# Patient Record
Sex: Female | Born: 1980 | Race: Black or African American | Hispanic: No | Marital: Single | Smoking: Former smoker
Health system: Southern US, Community
[De-identification: ages and names within clinical notes are randomized; demographics above are authoritative.]

## PROBLEM LIST (undated history)

## (undated) DIAGNOSIS — F419 Anxiety disorder, unspecified: Secondary | ICD-10-CM

## (undated) DIAGNOSIS — J45909 Unspecified asthma, uncomplicated: Secondary | ICD-10-CM

## (undated) HISTORY — DX: Anxiety disorder, unspecified: F41.9

## (undated) HISTORY — PX: APPENDECTOMY: SHX54

## (undated) HISTORY — DX: Unspecified asthma, uncomplicated: J45.909

---

## 2007-11-25 ENCOUNTER — Inpatient Hospital Stay (HOSPITAL_COMMUNITY): Admission: AD | Admit: 2007-11-25 | Discharge: 2007-11-27 | Payer: Self-pay | Admitting: Obstetrics

## 2009-04-15 ENCOUNTER — Emergency Department (HOSPITAL_COMMUNITY): Admission: EM | Admit: 2009-04-15 | Discharge: 2009-04-15 | Payer: Self-pay | Admitting: Family Medicine

## 2009-06-20 ENCOUNTER — Emergency Department (HOSPITAL_COMMUNITY): Admission: EM | Admit: 2009-06-20 | Discharge: 2009-06-20 | Payer: Self-pay | Admitting: Family Medicine

## 2010-12-30 LAB — POCT RAPID STREP A (OFFICE): Streptococcus, Group A Screen (Direct): NEGATIVE

## 2010-12-30 LAB — GC/CHLAMYDIA PROBE AMP, GENITAL: GC Probe Amp, Genital: NEGATIVE

## 2011-06-19 LAB — CBC
HCT: 27.9 — ABNORMAL LOW
HCT: 35.4 — ABNORMAL LOW
Hemoglobin: 12
Hemoglobin: 9.7 — ABNORMAL LOW
MCHC: 33.9
MCHC: 34.9
MCV: 90.8
MCV: 91.7
RBC: 3.9
RDW: 12.6
WBC: 9.4

## 2013-09-02 ENCOUNTER — Other Ambulatory Visit: Payer: Self-pay | Admitting: Occupational Medicine

## 2013-09-02 ENCOUNTER — Ambulatory Visit: Payer: Self-pay

## 2013-09-02 DIAGNOSIS — R52 Pain, unspecified: Secondary | ICD-10-CM

## 2013-10-23 ENCOUNTER — Ambulatory Visit (INDEPENDENT_AMBULATORY_CARE_PROVIDER_SITE_OTHER): Payer: 59 | Admitting: Obstetrics & Gynecology

## 2013-10-23 ENCOUNTER — Encounter: Payer: Self-pay | Admitting: Obstetrics & Gynecology

## 2013-10-23 VITALS — BP 137/88 | HR 89 | Temp 98.3°F | Wt 186.0 lb

## 2013-10-23 DIAGNOSIS — Z3202 Encounter for pregnancy test, result negative: Secondary | ICD-10-CM

## 2013-10-23 DIAGNOSIS — R102 Pelvic and perineal pain: Secondary | ICD-10-CM

## 2013-10-23 DIAGNOSIS — Z01419 Encounter for gynecological examination (general) (routine) without abnormal findings: Secondary | ICD-10-CM

## 2013-10-23 DIAGNOSIS — Z113 Encounter for screening for infections with a predominantly sexual mode of transmission: Secondary | ICD-10-CM

## 2013-10-23 LAB — POCT URINALYSIS DIPSTICK
BILIRUBIN UA: NEGATIVE
Blood, UA: NEGATIVE
Glucose, UA: NEGATIVE
KETONES UA: NEGATIVE
NITRITE UA: NEGATIVE
Spec Grav, UA: 1.02
Urobilinogen, UA: NEGATIVE
pH, UA: 5

## 2013-10-23 LAB — POCT URINE PREGNANCY: PREG TEST UR: NEGATIVE

## 2013-10-23 NOTE — Progress Notes (Signed)
Subjective:     Maria Howe is a 33 y.o. female here for a routine exam.  Current complaints:  Maria Howe states that she is having some intermittent right lower pelvic pain.  Maria Howe describes it at a dull achy pain.  Maria Howe states that she has not had change in d/c but has noticed odor.  Maria Howe also has nexplanon that is due to be removed in March. Maria Howe would like to know if it could be changed at today's visit. Maria Howe thinks she is in need of pap today.  Personal health questionnaire reviewed: yes.   Gynecologic History Patient's last menstrual period was 10/11/2013. Contraception: Nexplanon Last Pap: unsure. Results were: normal Last mammogram: n/a  The following portions of the patient's history were reviewed and updated as appropriate: allergies, current medications, past family history, past medical history, past social history, past surgical history and problem list.  Review of Systems Pertinent items are noted in HPI.   Objective:    BP 137/88  Pulse 89  Temp(Src) 98.3 F (36.8 C)  Wt 186 lb (84.369 kg)  LMP 10/11/2013      General appearance: alert Breasts: normal appearance, no masses or tenderness Abdomen: soft, non-tender; bowel sounds normal; no masses,  no organomegaly Pelvic: cervix normal in appearance, external genitalia normal, no adnexal masses or tenderness, uterus normal size, shape, and consistency and vagina normal without discharge    Assessment:   Healthy female exam. H/o primary dysmenorrhea   Plan:  Return for Nexplanon re-insertion

## 2013-10-24 ENCOUNTER — Encounter: Payer: Self-pay | Admitting: Obstetrics & Gynecology

## 2013-10-24 LAB — GC/CHLAMYDIA PROBE AMP
CT Probe RNA: NEGATIVE
GC Probe RNA: NEGATIVE

## 2013-10-24 LAB — WET PREP BY MOLECULAR PROBE
CANDIDA SPECIES: NEGATIVE
GARDNERELLA VAGINALIS: POSITIVE — AB
Trichomonas vaginosis: NEGATIVE

## 2013-10-24 LAB — PAP IG W/ RFLX HPV ASCU

## 2013-10-24 LAB — RPR

## 2013-10-24 LAB — HIV ANTIBODY (ROUTINE TESTING W REFLEX): HIV: NONREACTIVE

## 2013-10-24 NOTE — Patient Instructions (Signed)

## 2013-10-28 ENCOUNTER — Encounter: Payer: Self-pay | Admitting: Obstetrics & Gynecology

## 2013-10-29 ENCOUNTER — Encounter: Payer: Self-pay | Admitting: Obstetrics & Gynecology

## 2013-10-29 ENCOUNTER — Ambulatory Visit (INDEPENDENT_AMBULATORY_CARE_PROVIDER_SITE_OTHER): Payer: 59 | Admitting: Obstetrics & Gynecology

## 2013-10-29 VITALS — BP 135/82 | HR 85

## 2013-10-29 DIAGNOSIS — Z30017 Encounter for initial prescription of implantable subdermal contraceptive: Secondary | ICD-10-CM

## 2013-10-29 DIAGNOSIS — Z3046 Encounter for surveillance of implantable subdermal contraceptive: Secondary | ICD-10-CM

## 2013-10-29 DIAGNOSIS — Z3202 Encounter for pregnancy test, result negative: Secondary | ICD-10-CM

## 2013-10-29 DIAGNOSIS — Z01818 Encounter for other preprocedural examination: Secondary | ICD-10-CM

## 2013-10-29 DIAGNOSIS — Z309 Encounter for contraceptive management, unspecified: Secondary | ICD-10-CM

## 2013-10-29 LAB — POCT URINE PREGNANCY: Preg Test, Ur: NEGATIVE

## 2013-10-29 MED ORDER — NORETHINDRONE ACETATE 5 MG PO TABS: 5.0000 mg | ORAL_TABLET | Freq: Every day | ORAL | Status: DC

## 2013-10-29 MED ORDER — ETONOGESTREL 68 MG ~~LOC~~ IMPL: 1.0000 | DRUG_IMPLANT | Freq: Once | SUBCUTANEOUS | Status: AC

## 2013-10-29 NOTE — Addendum Note (Signed)
Addended by: Glendell DockerKNIGHT, Pershing Skidmore on: 10/29/2013 10:47 AM   Modules accepted: Orders
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
## 2013-11-05 ENCOUNTER — Ambulatory Visit: Payer: Self-pay | Admitting: Obstetrics & Gynecology

## 2013-11-10 ENCOUNTER — Other Ambulatory Visit: Payer: Self-pay | Admitting: *Deleted

## 2013-11-10 DIAGNOSIS — B9689 Other specified bacterial agents as the cause of diseases classified elsewhere: Secondary | ICD-10-CM

## 2013-11-10 DIAGNOSIS — N760 Acute vaginitis: Principal | ICD-10-CM

## 2013-11-10 MED ORDER — METRONIDAZOLE 500 MG PO TABS: 500.0000 mg | ORAL_TABLET | Freq: Two times a day (BID) | ORAL | Status: DC

## 2013-11-13 ENCOUNTER — Ambulatory Visit: Payer: Self-pay | Admitting: Obstetrics & Gynecology

## 2014-08-22 ENCOUNTER — Emergency Department (HOSPITAL_COMMUNITY)
Admission: EM | Admit: 2014-08-22 | Discharge: 2014-08-22 | Disposition: A | Discharge status: Home / Self Care | Payer: 59 | Attending: Emergency Medicine | Admitting: Emergency Medicine

## 2014-08-22 ENCOUNTER — Emergency Department (HOSPITAL_COMMUNITY): Payer: 59

## 2014-08-22 ENCOUNTER — Encounter (HOSPITAL_COMMUNITY): Payer: Self-pay | Admitting: Emergency Medicine

## 2014-08-22 DIAGNOSIS — Z720 Tobacco use: Secondary | ICD-10-CM | POA: Insufficient documentation

## 2014-08-22 DIAGNOSIS — Y998 Other external cause status: Secondary | ICD-10-CM | POA: Insufficient documentation

## 2014-08-22 DIAGNOSIS — S5011XA Contusion of right forearm, initial encounter: Secondary | ICD-10-CM | POA: Diagnosis not present

## 2014-08-22 DIAGNOSIS — Y92410 Unspecified street and highway as the place of occurrence of the external cause: Secondary | ICD-10-CM | POA: Diagnosis not present

## 2014-08-22 DIAGNOSIS — Z79899 Other long term (current) drug therapy: Secondary | ICD-10-CM | POA: Diagnosis not present

## 2014-08-22 DIAGNOSIS — J45909 Unspecified asthma, uncomplicated: Secondary | ICD-10-CM | POA: Insufficient documentation

## 2014-08-22 DIAGNOSIS — F419 Anxiety disorder, unspecified: Secondary | ICD-10-CM | POA: Insufficient documentation

## 2014-08-22 DIAGNOSIS — Y9389 Activity, other specified: Secondary | ICD-10-CM | POA: Diagnosis not present

## 2014-08-22 DIAGNOSIS — S8001XA Contusion of right knee, initial encounter: Secondary | ICD-10-CM | POA: Diagnosis not present

## 2014-08-22 DIAGNOSIS — Z792 Long term (current) use of antibiotics: Secondary | ICD-10-CM | POA: Diagnosis not present

## 2014-08-22 DIAGNOSIS — Z7952 Long term (current) use of systemic steroids: Secondary | ICD-10-CM | POA: Insufficient documentation

## 2014-08-22 DIAGNOSIS — M7918 Myalgia, other site: Secondary | ICD-10-CM

## 2014-08-22 DIAGNOSIS — Z880 Allergy status to penicillin: Secondary | ICD-10-CM | POA: Diagnosis not present

## 2014-08-22 DIAGNOSIS — S8002XA Contusion of left knee, initial encounter: Secondary | ICD-10-CM | POA: Insufficient documentation

## 2014-08-22 DIAGNOSIS — S40011A Contusion of right shoulder, initial encounter: Secondary | ICD-10-CM | POA: Diagnosis not present

## 2014-08-22 DIAGNOSIS — X088XXA Exposure to other specified smoke, fire and flames, initial encounter: Secondary | ICD-10-CM | POA: Insufficient documentation

## 2014-08-22 DIAGNOSIS — S30202A Contusion of unspecified external genital organ, female, initial encounter: Secondary | ICD-10-CM | POA: Diagnosis not present

## 2014-08-22 DIAGNOSIS — S4991XA Unspecified injury of right shoulder and upper arm, initial encounter: Secondary | ICD-10-CM | POA: Diagnosis present

## 2014-08-22 LAB — CARBOXYHEMOGLOBIN
Carboxyhemoglobin: 2.2 % — ABNORMAL HIGH (ref 0.5–1.5)
Methemoglobin: 2.7 % — ABNORMAL HIGH (ref 0.0–1.5)
O2 SAT: 97.4 %
TOTAL HEMOGLOBIN: 14.1 g/dL (ref 12.0–16.0)

## 2014-08-22 MED ORDER — METHOCARBAMOL 500 MG PO TABS: 1000.0000 mg | ORAL_TABLET | Freq: Four times a day (QID) | ORAL | Status: AC | PRN

## 2014-08-22 MED ORDER — METHOCARBAMOL 500 MG PO TABS
1000.0000 mg | ORAL_TABLET | Freq: Once | ORAL | Status: AC
  Administered 2014-08-22: 1000 mg via ORAL
  Filled 2014-08-22: qty 2
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
## 2014-08-22 NOTE — ED Notes (Signed)
Patient transported to X-ray 

## 2014-09-16 ENCOUNTER — Encounter (HOSPITAL_COMMUNITY): Payer: Self-pay | Admitting: Emergency Medicine

## 2014-09-16 ENCOUNTER — Emergency Department (INDEPENDENT_AMBULATORY_CARE_PROVIDER_SITE_OTHER)
Admission: EM | Admit: 2014-09-16 | Discharge: 2014-09-16 | Disposition: A | Discharge status: Home / Self Care | Payer: Self-pay | Source: Home / Self Care | Attending: Emergency Medicine | Admitting: Emergency Medicine

## 2014-09-16 DIAGNOSIS — R630 Anorexia: Secondary | ICD-10-CM

## 2014-09-16 DIAGNOSIS — M549 Dorsalgia, unspecified: Secondary | ICD-10-CM

## 2014-09-16 DIAGNOSIS — R5382 Chronic fatigue, unspecified: Secondary | ICD-10-CM

## 2014-09-16 DIAGNOSIS — R51 Headache: Secondary | ICD-10-CM

## 2014-09-16 DIAGNOSIS — R519 Headache, unspecified: Secondary | ICD-10-CM

## 2014-09-16 DIAGNOSIS — K529 Noninfective gastroenteritis and colitis, unspecified: Secondary | ICD-10-CM

## 2014-09-16 DIAGNOSIS — R110 Nausea: Secondary | ICD-10-CM

## 2014-09-16 DIAGNOSIS — M542 Cervicalgia: Secondary | ICD-10-CM

## 2014-09-16 DIAGNOSIS — G8929 Other chronic pain: Secondary | ICD-10-CM

## 2014-09-16 MED ORDER — PROCHLORPERAZINE MALEATE 5 MG PO TABS: 5.0000 mg | ORAL_TABLET | Freq: Three times a day (TID) | ORAL | Status: AC | PRN

## 2014-09-16 NOTE — Discharge Instructions (Signed)
My best advice to you would be to seek additional evaluation with your primary care doctor as soon as possible. May switch to using Compazine instead of Zofran for your nausea.  Back Exercises Back exercises help treat and prevent back injuries. The goal of back exercises is to increase the strength of your abdominal and back muscles and the flexibility of your back. These exercises should be started when you no longer have back pain. Back exercises include:  Pelvic Tilt. Lie on your back with your knees bent. Tilt your pelvis until the lower part of your back is against the floor. Hold this position 5 to 10 sec and repeat 5 to 10 times.  Knee to Chest. Pull first 1 knee up against your chest and hold for 20 to 30 seconds, repeat this with the other knee, and then both knees. This may be done with the other leg straight or bent, whichever feels better.  Sit-Ups or Curl-Ups. Bend your knees 90 degrees. Start with tilting your pelvis, and do a partial, slow sit-up, lifting your trunk only 30 to 45 degrees off the floor. Take at least 2 to 3 seconds for each sit-up. Do not do sit-ups with your knees out straight. If partial sit-ups are difficult, simply do the above but with only tightening your abdominal muscles and holding it as directed.  Hip-Lift. Lie on your back with your knees flexed 90 degrees. Push down with your feet and shoulders as you raise your hips a couple inches off the floor; hold for 10 seconds, repeat 5 to 10 times.  Back arches. Lie on your stomach, propping yourself up on bent elbows. Slowly press on your hands, causing an arch in your low back. Repeat 3 to 5 times. Any initial stiffness and discomfort should lessen with repetition over time.  Shoulder-Lifts. Lie face down with arms beside your body. Keep hips and torso pressed to floor as you slowly lift your head and shoulders off the floor. Do not overdo your exercises, especially in the beginning. Exercises may cause you some  mild back discomfort which lasts for a few minutes; however, if the pain is more severe, or lasts for more than 15 minutes, do not continue exercises until you see your caregiver. Improvement with exercise therapy for back problems is slow.  See your caregivers for assistance with developing a proper back exercise program. Document Released: 10/19/2004 Document Revised: 12/04/2011 Document Reviewed: 07/13/2011 [facility] Patient Information 2015 [place] [place]. This information is not intended to replace advice given to you by your health care provider. Make sure you discuss any questions you have with your health care provider.  Chronic Pain Chronic pain can be defined as pain that is off and on and lasts for 3-6 months or longer. Many things cause chronic pain, which can make it difficult to make a diagnosis. There are many treatment options available for chronic pain. However, finding a treatment that works well for you may require trying various approaches until the right one is found. Many people benefit from a combination of two or more types of treatment to control their pain. SYMPTOMS  Chronic pain can occur anywhere in the body and can range from mild to very severe. Some types of chronic pain include:  Headache.  Low back pain.  Cancer pain.  Arthritis pain.  Neurogenic pain. This is pain resulting from damage to nerves. People with chronic pain may also have other symptoms such as:  Depression.  Anger.  Insomnia.  Anxiety.  DIAGNOSIS  Your health care provider will help diagnose your condition over time. In many cases, the initial focus will be on excluding possible conditions that could be causing the pain. Depending on your symptoms, your health care provider may order tests to diagnose your condition. Some of these tests may include:   Blood tests.   CT scan.   MRI.   X-rays.   Ultrasounds.   Nerve conduction studies.  You may need to see a specialist.    TREATMENT  Finding treatment that works well may take time. You may be referred to a pain specialist. He or she may prescribe medicine or therapies, such as:   Mindful meditation or yoga.  Shots (injections) of numbing or pain-relieving medicines into the spine or area of pain.  Local electrical stimulation.  Acupuncture.   Massage therapy.   Aroma, color, light, or sound therapy.   Biofeedback.   Working with a physical therapist to keep from getting stiff.   Regular, gentle exercise.   Cognitive or behavioral therapy.   Group support.  Sometimes, surgery may be recommended.  HOME CARE INSTRUCTIONS   Take all medicines as directed by your health care provider.   Lessen stress in your life by relaxing and doing things such as listening to calming music.   Exercise or be active as directed by your health care provider.   Eat a healthy diet and include things such as vegetables, fruits, fish, and lean meats in your diet.   Keep all follow-up appointments with your health care provider.   Attend a support group with others suffering from chronic pain. SEEK MEDICAL CARE IF:   Your pain gets worse.   You develop a new pain that was not there before.   You cannot tolerate medicines given to you by your health care provider.   You have new symptoms since your last visit with your health care provider.  SEEK IMMEDIATE MEDICAL CARE IF:   You feel weak.   You have decreased sensation or numbness.   You lose control of bowel or bladder function.   Your pain suddenly gets much worse.   You develop shaking.  You develop chills.  You develop confusion.  You develop chest pain.  You develop shortness of breath.  MAKE SURE YOU:  Understand these instructions.  Will watch your condition.  Will get help right away if you are not doing well or get worse. Document Released: 06/03/2002 Document Revised: 05/14/2013 Document Reviewed:  03/07/2013 [facility] Patient Information 2015 [place], [place]. This information is not intended to replace advice given to you by your health care provider. Make sure you discuss any questions you have with your health care provider.  Diarrhea Diarrhea is frequent loose and watery bowel movements. It can cause you to feel weak and dehydrated. Dehydration can cause you to become tired and thirsty, have a dry mouth, and have decreased urination that often is dark yellow. Diarrhea is a sign of another problem, most often an infection that will not last long. In most cases, diarrhea typically lasts 2-3 days. However, it can last longer if it is a sign of something more serious. It is important to treat your diarrhea as directed by your caregiver to lessen or prevent future episodes of diarrhea. CAUSES  Some common causes include:  Gastrointestinal infections caused by viruses, bacteria, or parasites.  Food poisoning or food allergies.  Certain medicines, such as antibiotics, chemotherapy, and laxatives.  Artificial sweeteners and fructose.  Digestive disorders.  HOME CARE INSTRUCTIONS  Ensure adequate fluid intake (hydration): Have 1 cup (8 oz) of fluid for each diarrhea episode. Avoid fluids that contain simple sugars or sports drinks, fruit juices, whole milk products, and sodas. Your urine should be clear or pale yellow if you are drinking enough fluids. Hydrate with an oral rehydration solution that you can purchase at pharmacies, retail stores, and online. You can prepare an oral rehydration solution at home by mixing the following ingredients together:   - tsp table salt.   tsp baking soda.   tsp salt substitute containing potassium chloride.  1  tablespoons sugar.  1 L (34 oz) of water.  Certain foods and beverages may increase the speed at which food moves through the gastrointestinal (GI) tract. These foods and beverages should be avoided and include:  Caffeinated and alcoholic  beverages.  High-fiber foods, such as raw fruits and vegetables, nuts, seeds, and whole grain breads and cereals.  Foods and beverages sweetened with sugar alcohols, such as xylitol, sorbitol, and mannitol.  Some foods may be well tolerated and may help thicken stool including:  Starchy foods, such as rice, toast, pasta, low-sugar cereal, oatmeal, grits, baked potatoes, crackers, and bagels.  Bananas.  Applesauce.  Add probiotic-rich foods to help increase healthy bacteria in the GI tract, such as yogurt and fermented milk products.  Wash your hands well after each diarrhea episode.  Only take over-the-counter or prescription medicines as directed by your caregiver.  Take a warm bath to relieve any burning or pain from frequent diarrhea episodes. SEEK IMMEDIATE MEDICAL CARE IF:   You are unable to keep fluids down.  You have persistent vomiting.  You have blood in your stool, or your stools are black and tarry.  You do not urinate in 6-8 hours, or there is only a small amount of very dark urine.  You have abdominal pain that increases or localizes.  You have weakness, dizziness, confusion, or light-headedness.  You have a severe headache.  Your diarrhea gets worse or does not get better.  You have a fever or persistent symptoms for more than 2-3 days.  You have a fever and your symptoms suddenly get worse. MAKE SURE YOU:   Understand these instructions.  Will watch your condition.  Will get help right away if you are not doing well or get worse. Document Released: 09/01/2002 Document Revised: 01/26/2014 Document Reviewed: 05/19/2012 [facility] Patient Information 2015 [place], [place]. This information is not intended to replace advice given to you by your health care provider. Make sure you discuss any questions you have with your health care provider.  General Headache Without Cause A headache is pain or discomfort felt around the head or neck area. The specific  cause of a headache may not be found. There are many causes and types of headaches. A few common ones are:  Tension headaches.  Migraine headaches.  Cluster headaches.  Chronic daily headaches. HOME CARE INSTRUCTIONS   Keep all follow-up appointments with your caregiver or any specialist referral.  Only take over-the-counter or prescription medicines for pain or discomfort as directed by your caregiver.  Lie down in a dark, quiet room when you have a headache.  Keep a headache journal to find out what may trigger your migraine headaches. For example, write down:  What you eat and drink.  How much sleep you get.  Any change to your diet or medicines.  Try massage or other relaxation techniques.  Put ice packs or heat on the  head and neck. Use these 3 to 4 times per day for 15 to 20 minutes each time, or as needed.  Limit stress.  Sit up straight, and do not tense your muscles.  Quit smoking if you smoke.  Limit alcohol use.  Decrease the amount of caffeine you drink, or stop drinking caffeine.  Eat and sleep on a regular schedule.  Get 7 to 9 hours of sleep, or as recommended by your caregiver.  Keep lights dim if bright lights bother you and make your headaches worse. SEEK MEDICAL CARE IF:   You have problems with the medicines you were prescribed.  Your medicines are not working.  You have a change from the usual headache.  You have nausea or vomiting. SEEK IMMEDIATE MEDICAL CARE IF:   Your headache becomes severe.  You have a fever.  You have a stiff neck.  You have loss of vision.  You have muscular weakness or loss of muscle control.  You start losing your balance or have trouble walking.  You feel faint or pass out.  You have severe symptoms that are different from your first symptoms. MAKE SURE YOU:   Understand these instructions.  Will watch your condition.  Will get help right away if you are not doing well or get worse. Document  Released: 09/11/2005 Document Revised: 12/04/2011 Document Reviewed: 09/27/2011 [facility] Patient Information 2015 [place], [place]. This information is not intended to replace advice given to you by your health care provider. Make sure you discuss any questions you have with your health care provider.  Headaches, Frequently Asked Questions MIGRAINE HEADACHES Q: What is migraine? What causes it? How can I treat it? A: Generally, migraine headaches begin as a dull ache. Then they develop into a constant, throbbing, and pulsating pain. You may experience pain at the temples. You may experience pain at the front or back of one or both sides of the head. The pain is usually accompanied by a combination of:  Nausea.  Vomiting.  Sensitivity to light and noise. Some people (about 15%) experience an aura (see below) before an attack. The cause of migraine is believed to be chemical reactions in the brain. Treatment for migraine may include over-the-counter or prescription medications. It may also include self-help techniques. These include relaxation training and biofeedback.  Q: What is an aura? A: About 15% of people with migraine get an "aura". This is a sign of neurological symptoms that occur before a migraine headache. You may see wavy or jagged lines, dots, or flashing lights. You might experience tunnel vision or blind spots in one or both eyes. The aura can include visual or auditory hallucinations (something imagined). It may include disruptions in smell (such as strange odors), taste or touch. Other symptoms include:  Numbness.  A "pins and needles" sensation.  Difficulty in recalling or speaking the correct word. These neurological events may last as long as 60 minutes. These symptoms will fade as the headache begins. Q: What is a trigger? A: Certain physical or environmental factors can lead to or "trigger" a migraine. These include:  Foods.  Hormonal  changes.  Weather.  Stress. It is important to remember that triggers are different for everyone. To help prevent migraine attacks, you need to figure out which triggers affect you. Keep a headache diary. This is a good way to track triggers. The diary will help you talk to your healthcare professional about your condition. Q: Does weather affect migraines? A: Bright sunshine, hot, humid conditions,  and drastic changes in barometric pressure may lead to, or "trigger," a migraine attack in some people. But studies have shown that weather does not act as a trigger for everyone with migraines. Q: What is the link between migraine and hormones? A: Hormones start and regulate many of your body's functions. Hormones keep your body in balance within a constantly changing environment. The levels of hormones in your body are unbalanced at times. Examples are during menstruation, pregnancy, or menopause. That can lead to a migraine attack. In fact, about three quarters of all women with migraine report that their attacks are related to the menstrual cycle.  Q: Is there an increased risk of stroke for migraine sufferers? A: The likelihood of a migraine attack causing a stroke is very remote. That is not to say that migraine sufferers cannot have a stroke associated with their migraines. In persons under age 39, the most common associated factor for stroke is migraine headache. But over the course of a person's normal life span, the occurrence of migraine headache may actually be associated with a reduced risk of dying from cerebrovascular disease due to stroke.  Q: What are acute medications for migraine? A: Acute medications are used to treat the pain of the headache after it has started. Examples over-the-counter medications, NSAIDs, ergots, and triptans.  Q: What are the triptans? A: Triptans are the newest class of abortive medications. They are specifically targeted to treat migraine. Triptans are  vasoconstrictors. They moderate some chemical reactions in the brain. The triptans work on receptors in your brain. Triptans help to restore the balance of a neurotransmitter called serotonin. Fluctuations in levels of serotonin are thought to be a main cause of migraine.  Q: Are over-the-counter medications for migraine effective? A: Over-the-counter, or "OTC," medications may be effective in relieving mild to moderate pain and associated symptoms of migraine. But you should see your caregiver before beginning any treatment regimen for migraine.  Q: What are preventive medications for migraine? A: Preventive medications for migraine are sometimes referred to as "prophylactic" treatments. They are used to reduce the frequency, severity, and length of migraine attacks. Examples of preventive medications include antiepileptic medications, antidepressants, beta-blockers, calcium channel blockers, and NSAIDs (nonsteroidal anti-inflammatory drugs). Q: Why are anticonvulsants used to treat migraine? A: During the past few years, there has been an increased interest in antiepileptic drugs for the prevention of migraine. They are sometimes referred to as "anticonvulsants". Both epilepsy and migraine may be caused by similar reactions in the brain.  Q: Why are antidepressants used to treat migraine? A: Antidepressants are typically used to treat people with depression. They may reduce migraine frequency by regulating chemical levels, such as serotonin, in the brain.  Q: What alternative therapies are used to treat migraine? A: The term "alternative therapies" is often used to describe treatments considered outside the scope of conventional Western medicine. Examples of alternative therapy include acupuncture, acupressure, and yoga. Another common alternative treatment is herbal therapy. Some herbs are believed to relieve headache pain. Always discuss alternative therapies with your caregiver before proceeding. Some  herbal products contain arsenic and other toxins. TENSION HEADACHES Q: What is a tension-type headache? What causes it? How can I treat it? A: Tension-type headaches occur randomly. They are often the result of temporary stress, anxiety, fatigue, or anger. Symptoms include soreness in your temples, a tightening band-like sensation around your head (a "vice-like" ache). Symptoms can also include a pulling feeling, pressure sensations, and contracting head and neck muscles.  The headache begins in your forehead, temples, or the back of your head and neck. Treatment for tension-type headache may include over-the-counter or prescription medications. Treatment may also include self-help techniques such as relaxation training and biofeedback. CLUSTER HEADACHES Q: What is a cluster headache? What causes it? How can I treat it? A: Cluster headache gets its name because the attacks come in groups. The pain arrives with little, if any, warning. It is usually on one side of the head. A tearing or bloodshot eye and a runny nose on the same side of the headache may also accompany the pain. Cluster headaches are believed to be caused by chemical reactions in the brain. They have been described as the most severe and intense of any headache type. Treatment for cluster headache includes prescription medication and oxygen. SINUS HEADACHES Q: What is a sinus headache? What causes it? How can I treat it? A: When a cavity in the bones of the face and skull (a sinus) becomes inflamed, the inflammation will cause localized pain. This condition is usually the result of an allergic reaction, a tumor, or an infection. If your headache is caused by a sinus blockage, such as an infection, you will probably have a fever. An x-ray will confirm a sinus blockage. Your caregiver's treatment might include antibiotics for the infection, as well as antihistamines or decongestants.  REBOUND HEADACHES Q: What is a rebound headache? What  causes it? How can I treat it? A: A pattern of taking acute headache medications too often can lead to a condition known as "rebound headache." A pattern of taking too much headache medication includes taking it more than 2 days per week or in excessive amounts. That means more than the label or a caregiver advises. With rebound headaches, your medications not only stop relieving pain, they actually begin to cause headaches. Doctors treat rebound headache by tapering the medication that is being overused. Sometimes your caregiver will gradually substitute a different type of treatment or medication. Stopping may be a challenge. Regularly overusing a medication increases the potential for serious side effects. Consult a caregiver if you regularly use headache medications more than 2 days per week or more than the label advises. ADDITIONAL QUESTIONS AND ANSWERS Q: What is biofeedback? A: Biofeedback is a self-help treatment. Biofeedback uses special equipment to monitor your body's involuntary physical responses. Biofeedback monitors:  Breathing.  Pulse.  Heart rate.  Temperature.  Muscle tension.  Brain activity. Biofeedback helps you refine and perfect your relaxation exercises. You learn to control the physical responses that are related to stress. Once the technique has been mastered, you do not need the equipment any more. Q: Are headaches hereditary? A: Four out of five (80%) of people that suffer report a family history of migraine. Scientists are not sure if this is genetic or a family predisposition. Despite the uncertainty, a child has a 50% chance of having migraine if one parent suffers. The child has a 75% chance if both parents suffer.  Q: Can children get headaches? A: By the time they reach high school, most young people have experienced some type of headache. Many safe and effective approaches or medications can prevent a headache from occurring or stop it after it has begun.  Q:  What type of doctor should I see to diagnose and treat my headache? A: Start with your primary caregiver. Discuss his or her experience and approach to headaches. Discuss methods of classification, diagnosis, and treatment. Your caregiver may decide to  recommend you to a headache specialist, depending upon your symptoms or other physical conditions. Having diabetes, allergies, etc., may require a more comprehensive and inclusive approach to your headache. The National Head[employer] Foundation will provide, upon request, a list of [facility] physician members in your state. Document Released: 12/02/2003 Document Revised: 12/04/2011 Document Reviewed: 05/11/2008 [facility] Patient Information 2015 [place], [place]. This information is not intended to replace advice given to you by your health care provider. Make sure you discuss any questions you have with your health care provider.  Musculoskeletal Pain Musculoskeletal pain is muscle and boney aches and pains. These pains can occur in any part of the body. Your caregiver may treat you without knowing the cause of the pain. They may treat you if blood or urine tests, X-rays, and other tests were normal.  CAUSES There is often not a definite cause or reason for these pains. These pains may be caused by a type of germ (virus). The discomfort may also come from overuse. Overuse includes working out too hard when your body is not fit. Boney aches also come from weather changes. Bone is sensitive to atmospheric pressure changes. HOME CARE INSTRUCTIONS   Ask when your test results will be ready. Make sure you get your test results.  Only take over-the-counter or prescription medicines for pain, discomfort, or fever as directed by your caregiver. If you were given medications for your condition, do not drive, operate machinery or power tools, or sign legal documents for 24 hours. Do not drink alcohol. Do not take sleeping pills or other medications that may interfere with  treatment.  Continue all activities unless the activities cause more pain. When the pain lessens, slowly resume normal activities. Gradually increase the intensity and duration of the activities or exercise.  During periods of severe pain, bed rest may be helpful. Lay or sit in any position that is comfortable.  Putting ice on the injured area.  Put ice in a bag.  Place a towel between your skin and the bag.  Leave the ice on for 15 to 20 minutes, 3 to 4 times a day.  Follow up with your caregiver for continued problems and no reason can be found for the pain. If the pain becomes worse or does not go away, it may be necessary to repeat tests or do additional testing. Your caregiver may need to look further for a possible cause. SEEK IMMEDIATE MEDICAL CARE IF:  You have pain that is getting worse and is not relieved by medications.  You develop chest pain that is associated with shortness or breath, sweating, feeling sick to your stomach (nauseous), or throw up (vomit).  Your pain becomes localized to the abdomen.  You develop any new symptoms that seem different or that concern you. MAKE SURE YOU:   Understand these instructions.  Will watch your condition.  Will get help right away if you are not doing well or get worse. Document Released: 09/11/2005 Document Revised: 12/04/2011 Document Reviewed: 05/16/2013 [facility] Patient Information 2015 [place], [place]. This information is not intended to replace advice given to you by your health care provider. Make sure you discuss any questions you have with your health care provider.  Nausea, Adult Nausea is the feeling that you have an upset stomach or have to vomit. Nausea by itself is not likely a serious concern, but it may be an early sign of more serious medical problems. As nausea gets worse, it can lead to vomiting. If vomiting develops, there is  the risk of dehydration.  CAUSES   Viral infections.  Food  poisoning.  Medicines.  Pregnancy.  Motion sickness.  Migraine headaches.  Emotional distress.  Severe pain from any source.  Alcohol intoxication. HOME CARE INSTRUCTIONS  Get plenty of rest.  Ask your caregiver about specific rehydration instructions.  Eat small amounts of food and sip liquids more often.  Take all medicines as told by your caregiver. SEEK MEDICAL CARE IF:  You have not improved after 2 days, or you get worse.  You have a headache. SEEK IMMEDIATE MEDICAL CARE IF:   You have a fever.  You faint.  You keep vomiting or have blood in your vomit.  You are extremely weak or dehydrated.  You have dark or bloody stools.  You have severe chest or abdominal pain. MAKE SURE YOU:  Understand these instructions.  Will watch your condition.  Will get help right away if you are not doing well or get worse. Document Released: 10/19/2004 Document Revised: 06/05/2012 Document Reviewed: 05/24/2011 [facility] Patient Information 2015 [place], [place]. This information is not intended to replace advice given to you by your health care provider. Make sure you discuss any questions you have with your health care provider.
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
## 2014-09-16 NOTE — ED Notes (Signed)
C/o persistent back and neck pain onset 11/27 [facility] 11/28  Sx today also include: nauseas, decreased appetite Alert, no signs of acute distress.

## 2014-09-21 ENCOUNTER — Encounter: Payer: Self-pay | Admitting: *Deleted

## 2014-11-04 ENCOUNTER — Telehealth: Payer: Self-pay | Admitting: Obstetrics

## 2014-11-09 NOTE — Telephone Encounter (Signed)
028/15/2016 - Left patient a repeat message regarding insurance. brm

## 2014-11-12 ENCOUNTER — Encounter: Payer: Self-pay | Admitting: Obstetrics

## 2014-11-12 ENCOUNTER — Ambulatory Visit (INDEPENDENT_AMBULATORY_CARE_PROVIDER_SITE_OTHER): Payer: 59 | Admitting: Obstetrics

## 2014-11-12 ENCOUNTER — Telehealth: Payer: Self-pay | Admitting: *Deleted

## 2014-11-12 DIAGNOSIS — N760 Acute vaginitis: Secondary | ICD-10-CM

## 2014-11-12 DIAGNOSIS — Z01419 Encounter for gynecological examination (general) (routine) without abnormal findings: Secondary | ICD-10-CM

## 2014-11-12 DIAGNOSIS — Z124 Encounter for screening for malignant neoplasm of cervix: Secondary | ICD-10-CM

## 2014-11-12 DIAGNOSIS — A499 Bacterial infection, unspecified: Secondary | ICD-10-CM

## 2014-11-12 DIAGNOSIS — B9689 Other specified bacterial agents as the cause of diseases classified elsewhere: Secondary | ICD-10-CM

## 2014-11-12 NOTE — Progress Notes (Signed)
Subjective:        Maria Howe is a 34 y.o. female here for a routine exam.  Current complaints: none.    Personal health questionnaire:  Is patient Maria Howe, have a family history of breast and/or ovarian cancer: no Is there a family history of uterine cancer diagnosed at age < 102, gastrointestinal cancer, urinary tract cancer, family member who is a Personnel officer syndrome-associated carrier: no Is the patient overweight and hypertensive, family history of diabetes, personal history of gestational diabetes, preeclampsia or PCOS: no Is patient over 31, have PCOS,  family history of premature CHD under age 60, diabetes, smoke, have hypertension or peripheral artery disease:  no At any time, has a partner hit, kicked or otherwise hurt or frightened you?: no Over the past 2 weeks, have you felt down, depressed or hopeless?: no Over the past 2 weeks, have you felt little interest or pleasure in doing things?:no   Gynecologic History No LMP recorded. Contraception: Nexplanon Last Pap: 2015. Results were: normal Last mammogram: n/a. Results were: n/a  Obstetric History OB History  No data available    Past Medical History  Diagnosis Date  . Asthma   . Anxiety     Past Surgical History  Procedure Laterality Date  . Appendectomy       Current outpatient prescriptions:  .  albuterol (PROVENTIL HFA;VENTOLIN HFA) 108 (90 BASE) MCG/ACT inhaler, Inhale into the lungs every 6 (six) hours as needed for wheezing or shortness of breath., Disp: , Rfl:  .  ALPRAZolam (XANAX) 0.25 MG tablet, Take 0.25-0.5 mg by mouth at bedtime as needed for anxiety. , Disp: , Rfl:  .  butalbital-acetaminophen-caffeine (FIORICET, ESGIC) 50-325-40 MG per tablet, Take 1 tablet by mouth every 4 (four) hours as needed for headache., Disp: , Rfl:  .  diphenhydrAMINE (BENADRYL) 25 MG tablet, Take 50 mg by mouth at bedtime as needed for itching., Disp: , Rfl:  .  doxycycline (ORACEA) 40 MG capsule, Take 40  mg by mouth every morning., Disp: , Rfl:  .  etonogestrel (NEXPLANON) 68 MG IMPL implant, Inject 1 each (68 mg total) into the skin once., Disp: 1 each, Rfl: 0 .  hydrOXYzine (ATARAX/VISTARIL) 10 MG tablet, Take 10 mg by mouth 3 (three) times daily as needed for itching. , Disp: , Rfl:  .  ibuprofen (ADVIL,MOTRIN) 800 MG tablet, Take 800 mg by mouth every 8 (eight) hours as needed., Disp: , Rfl:  .  tiZANidine (ZANAFLEX) 4 MG tablet, Take 4 mg by mouth every 6 (six) hours as needed for muscle spasms., Disp: , Rfl:  .  Topiramate ER (TROKENDI XR) 100 MG CP24, Take 100 mg by mouth daily with breakfast., Disp: , Rfl:  .  zolpidem (AMBIEN) 10 MG tablet, Take 10 mg by mouth at bedtime as needed for sleep., Disp: , Rfl:  .  EPINEPHrine (EPIPEN 2-PAK) 0.3 mg/0.3 mL IJ SOAJ injection, Inject 0.3 mg into the muscle once., Disp: , Rfl:  .  methocarbamol (ROBAXIN) 500 MG tablet, Take 2 tablets (1,000 mg total) by mouth 4 (four) times daily as needed (Pain). (Patient not taking: Reported on 11/12/2014), Disp: 20 tablet, Rfl: 0 .  prochlorperazine (COMPAZINE) 5 MG tablet, Take 1 tablet (5 mg total) by mouth every 8 (eight) hours as needed for nausea. (Patient not taking: Reported on 11/12/2014), Disp: 20 tablet, Rfl: 0 .  triamcinolone (KENALOG) 0.025 % cream, Apply 1 application topically 2 (two) times daily as needed (for itching)., Disp: ,  Rfl:  .  [DISCONTINUED] norethindrone (AYGESTIN) 5 MG tablet, Take 1 tablet (5 mg total) by mouth daily. (Patient not taking: Reported on 08/22/2014), Disp: 90 tablet, Rfl: 0 Allergies  Allergen Reactions  . Peanuts [Peanut Oil] Anaphylaxis, Hives, Shortness Of Breath and Itching  . Penicillins     Unknown reaction  . Shellfish Allergy Anaphylaxis, Hives, Shortness Of Breath and Itching  . Tramadol Hives and Rash  . Apple Itching and Other (See Comments)    numbness  . Cherry Itching and Other (See Comments)    numbness  . Eggs Or Egg-Derived Products Itching and Other  (See Comments)    numbness  . Kiwi Extract Itching and Other (See Comments)    numbness  . Peach [Prunus Persica] Itching and Other (See Comments)    numbness  . Plum Pulp Itching and Other (See Comments)    Numbness  . Sulfa Antibiotics Hives    History  Substance Use Topics  . Smoking status: Light Tobacco Smoker  . Smokeless tobacco: Not on file  . Alcohol Use: Yes     Comment: social    Family History  Problem Relation Age of Onset  . Cancer Maternal Grandmother   . Cancer Maternal Grandfather   . Hypertension Maternal Grandfather   . Heart disease Maternal Grandfather   . Cancer Paternal Grandmother   . Hypertension Mother       Review of Systems  Constitutional: negative for fatigue and weight loss Respiratory: negative for cough and wheezing Cardiovascular: negative for chest pain, fatigue and palpitations Gastrointestinal: negative for abdominal pain and change in bowel habits Musculoskeletal:negative for myalgias Neurological: negative for gait problems and tremors Behavioral/Psych: negative for abusive relationship, depression Endocrine: negative for temperature intolerance   Genitourinary:negative for abnormal menstrual periods, genital lesions, hot flashes, sexual problems and vaginal discharge Integument/breast: negative for breast lump, breast tenderness, nipple discharge and skin lesion(s)    Objective:       There were no vitals taken for this visit. General:   alert  Skin:   no rash or abnormalities  Lungs:   clear to auscultation bilaterally  Heart:   regular rate and rhythm, S1, S2 normal, no murmur, click, rub or gallop  Breasts:   normal without suspicious masses, skin or nipple changes or axillary nodes  Abdomen:  normal findings: no organomegaly, soft, non-tender and no hernia  Pelvis:  External genitalia: normal general appearance Urinary system: urethral meatus normal and bladder without fullness, nontender Vaginal: normal without  tenderness, induration or masses Cervix: normal appearance Adnexa: normal bimanual exam Uterus: anteverted and non-tender, normal size   Lab Review Urine pregnancy test Labs reviewed yes Radiologic studies reviewed no    Assessment:    Healthy female exam.    Plan:    Education reviewed: low fat, low cholesterol diet, safe sex/STD prevention, self breast exams and weight bearing exercise. Contraception: Nexplanon. Follow up in: 1 year.   Meds ordered this encounter  Medications  . tiZANidine (ZANAFLEX) 4 MG tablet    Sig: Take 4 mg by mouth every 6 (six) hours as needed for muscle spasms.  Marland Kitchen. ibuprofen (ADVIL,MOTRIN) 800 MG tablet    Sig: Take 800 mg by mouth every 8 (eight) hours as needed.   No orders of the defined types were placed in this encounter.

## 2014-11-12 NOTE — Telephone Encounter (Signed)
Patient was in the office today and she could not remember what she had taken for BTB with the Nexplanon. Looking at her chart- she was given lo Estrin after the insertion and later she took Aygestin. LM on patient VM and advised to call back with her preference.

## 2014-11-13 ENCOUNTER — Encounter: Payer: Self-pay | Admitting: Obstetrics

## 2014-11-13 LAB — HIV ANTIBODY (ROUTINE TESTING W REFLEX): HIV 1&2 Ab, 4th Generation: NONREACTIVE

## 2014-11-13 LAB — HEPATITIS C ANTIBODY: HCV Ab: NEGATIVE

## 2014-11-13 LAB — RPR

## 2014-11-13 LAB — HEPATITIS B SURFACE ANTIGEN: Hepatitis B Surface Ag: NEGATIVE

## 2014-11-18 LAB — PAP IG AND HPV HIGH-RISK: HPV DNA High Risk: DETECTED — AB

## 2014-11-23 LAB — SURESWAB, VAGINOSIS/VAGINITIS PLUS

## 2014-11-26 ENCOUNTER — Other Ambulatory Visit (HOSPITAL_COMMUNITY): Payer: Self-pay | Admitting: Internal Medicine

## 2014-11-26 DIAGNOSIS — R110 Nausea: Secondary | ICD-10-CM

## 2014-12-07 ENCOUNTER — Ambulatory Visit (HOSPITAL_COMMUNITY): Admission: RE | Admit: 2014-12-07 | Payer: 59 | Source: Ambulatory Visit

## 2014-12-10 ENCOUNTER — Encounter: Payer: Self-pay | Admitting: Obstetrics

## 2014-12-10 ENCOUNTER — Other Ambulatory Visit: Payer: Self-pay | Admitting: Obstetrics

## 2014-12-10 ENCOUNTER — Ambulatory Visit (INDEPENDENT_AMBULATORY_CARE_PROVIDER_SITE_OTHER): Payer: 59 | Admitting: Obstetrics

## 2014-12-10 VITALS — BP 143/86 | HR 83 | Temp 99.0°F | Wt 166.0 lb

## 2014-12-10 DIAGNOSIS — R87810 Cervical high risk human papillomavirus (HPV) DNA test positive: Secondary | ICD-10-CM

## 2014-12-10 DIAGNOSIS — R87610 Atypical squamous cells of undetermined significance on cytologic smear of cervix (ASC-US): Secondary | ICD-10-CM

## 2014-12-10 DIAGNOSIS — Z01812 Encounter for preprocedural laboratory examination: Secondary | ICD-10-CM | POA: Diagnosis not present

## 2014-12-10 LAB — POCT URINE PREGNANCY: Preg Test, Ur: NEGATIVE

## 2014-12-10 NOTE — Progress Notes (Signed)
Colposcopy Procedure Note  Indications: Pap smear 1 months ago showed: ASCUS with POSITIVE high risk HPV. The prior pap showed no abnormalities.  Prior cervical/vaginal disease: normal exam without visible pathology. Prior cervical treatment: no treatment.  Procedure Details  The risks and benefits of the procedure and Written informed consent obtained.  A time-out was performed confirming the patient, procedure and allergy status  Speculum placed in vagina and excellent visualization of cervix achieved, cervix swabbed x 3 with acetic acid solution.  Findings: Cervix: no visible lesions; SCJ visualized 360 degrees without lesions.   Vaginal inspection: normal without visible lesions. Vulvar colposcopy: vulvar colposcopy not performed.   Physical Exam:      ECC, and cervical biopsies done at 6 and 12 o'clock   Specimens: ECC, Cervical Biopsies  Complications: none.  Plan: Specimens labelled and sent to Pathology. Will base further treatment on Pathology findings. Post biopsy instructions given to patient. Return to discuss Pathology results in 2 weeks.

## 2014-12-14 LAB — SURESWAB, VAGINOSIS/VAGINITIS PLUS
Atopobium vaginae: NOT DETECTED Log (cells/mL)
C. ALBICANS, DNA: NOT DETECTED
C. GLABRATA, DNA: NOT DETECTED
C. parapsilosis, DNA: NOT DETECTED
C. trachomatis RNA, TMA: NOT DETECTED
C. tropicalis, DNA: NOT DETECTED
Gardnerella vaginalis: NOT DETECTED Log (cells/mL)
LACTOBACILLUS SPECIES: >8 Log (cells/mL)
MEGASPHAERA SPECIES: NOT DETECTED Log (cells/mL)
N. GONORRHOEAE RNA, TMA: NOT DETECTED
T. vaginalis RNA, QL TMA: NOT DETECTED

## 2014-12-15 ENCOUNTER — Other Ambulatory Visit: Payer: Self-pay | Admitting: *Deleted

## 2014-12-15 DIAGNOSIS — N939 Abnormal uterine and vaginal bleeding, unspecified: Secondary | ICD-10-CM

## 2014-12-15 MED ORDER — NORETHINDRONE ACETATE 5 MG PO TABS: 10.0000 mg | ORAL_TABLET | Freq: Every day | ORAL | Status: AC

## 2014-12-28 ENCOUNTER — Ambulatory Visit: Payer: 59 | Admitting: Obstetrics

## 2014-12-31 ENCOUNTER — Ambulatory Visit: Payer: 59 | Admitting: Obstetrics

## 2015-02-23 ENCOUNTER — Telehealth: Payer: Self-pay | Admitting: *Deleted

## 2015-02-23 NOTE — Telephone Encounter (Signed)
Requested call back 5:31 LM on VM to CB

## 2015-02-24 ENCOUNTER — Other Ambulatory Visit: Payer: Self-pay | Admitting: *Deleted

## 2015-02-24 DIAGNOSIS — T3695XA Adverse effect of unspecified systemic antibiotic, initial encounter: Principal | ICD-10-CM

## 2015-02-24 DIAGNOSIS — B379 Candidiasis, unspecified: Secondary | ICD-10-CM

## 2015-02-24 MED ORDER — FLUCONAZOLE 150 MG PO TABS: 150.0000 mg | ORAL_TABLET | ORAL | Status: DC

## 2015-02-24 NOTE — Telephone Encounter (Signed)
Patient state she was treated for BV and now is itching and thinks she has yeast. Patient is going out of town and would like treatment. Rx sent to pharmacy per Dr Clearance CootsHarper permission.

## 2015-03-03 ENCOUNTER — Telehealth: Payer: Self-pay | Admitting: *Deleted

## 2015-03-03 DIAGNOSIS — N760 Acute vaginitis: Secondary | ICD-10-CM

## 2015-03-03 MED ORDER — HYLAFEM VA SUPP: 6.0000 | Freq: Every evening | VAGINAL | Status: AC | PRN

## 2015-03-03 NOTE — Telephone Encounter (Signed)
Patient states she is some better, but is still having symptoms- offered patient appointment- but with her work schedule she is going to have a difficult time getting to the office. Patient is willing to try the Hylafem to treat her symptoms.

## 2015-03-14 ENCOUNTER — Encounter (HOSPITAL_COMMUNITY): Payer: Self-pay | Admitting: *Deleted

## 2015-03-14 ENCOUNTER — Emergency Department (HOSPITAL_COMMUNITY)
Admission: EM | Admit: 2015-03-14 | Discharge: 2015-03-14 | Disposition: A | Discharge status: Home / Self Care | Payer: 59 | Source: Home / Self Care | Attending: Family Medicine | Admitting: Family Medicine

## 2015-03-14 DIAGNOSIS — H6093 Unspecified otitis externa, bilateral: Secondary | ICD-10-CM | POA: Diagnosis not present

## 2015-03-14 DIAGNOSIS — B3731 Acute candidiasis of vulva and vagina: Secondary | ICD-10-CM

## 2015-03-14 DIAGNOSIS — B373 Candidiasis of vulva and vagina: Secondary | ICD-10-CM

## 2015-03-14 MED ORDER — CIPROFLOXACIN-HYDROCORTISONE 0.2-1 % OT SUSP: 3.0000 [drp] | Freq: Two times a day (BID) | OTIC | Status: AC

## 2015-03-14 MED ORDER — FLUCONAZOLE 150 MG PO TABS: 150.0000 mg | ORAL_TABLET | Freq: Once | ORAL | Status: DC

## 2015-03-14 MED ORDER — TERCONAZOLE 80 MG VA SUPP: 80.0000 mg | Freq: Every day | VAGINAL | Status: AC

## 2015-03-14 NOTE — ED Notes (Signed)
Pt  Has   Several  Symptoms    She  repotrts  She  Was   Seen  By  Ob  Gyn  sev  Weeks  Ago  For  A  Yeast  Infection   And  Was  rx  Diflucan       -       And  Subsequently  Boric  Acid         She  Reports            Vaginal irritation            She  States  She  Is  abstinate    For  One  Year       Pt  Also  Reports   Drainage  From  Both   Ears        As  Well  For  sev  Weeks

## 2015-03-14 NOTE — ED Provider Notes (Signed)
CSN: [id number]     Arrival date & time 03/14/15  1933 History   First MD Initiated Contact with Patient 03/14/15 1942     Chief Complaint  Patient presents with  . Ear Problem   (Consider location/radiation/quality/duration/timing/severity/associated sxs/prior Treatment) Patient is a 34 y.o. female presenting with ear drainage. The history is provided by the patient.  Ear Drainage This is a new problem. The current episode started more than 1 week ago. The problem has not changed since onset.Pertinent negatives include no chest pain, no abdominal pain and no shortness of breath.    Past Medical History  Diagnosis Date  . Asthma   . Anxiety    Past Surgical History  Procedure Laterality Date  . Appendectomy     Family History  Problem Relation Age of Onset  . Cancer Maternal Grandmother   . Cancer Maternal Grandfather   . Hypertension Maternal Grandfather   . Heart disease Maternal Grandfather   . Cancer Paternal Grandmother   . Hypertension Mother    History  Substance Use Topics  . Smoking status: Former Games developer  . Smokeless tobacco: Not on file  . Alcohol Use: Yes     Comment: social   OB History    Gravida Para Term Preterm AB TAB SAB Ectopic Multiple Living   0 0 0 0 0 0 0 0 0 0      Review of Systems  Constitutional: Negative.   HENT: Positive for ear discharge and ear pain.   Respiratory: Negative for shortness of breath.   Cardiovascular: Negative for chest pain.  Gastrointestinal: Negative.  Negative for abdominal pain.  Genitourinary: Positive for vaginal discharge.    Allergies  Peanuts; Penicillins; Shellfish allergy; Tramadol; Apple; Cherry; Eggs or egg-derived products; Kiwi extract; Peach; Plum pulp; and Sulfa antibiotics  Home Medications   Prior to Admission medications   Medication Sig Start Date End Date Taking? Authorizing Provider  albuterol (PROVENTIL HFA;VENTOLIN HFA) 108 (90 BASE) MCG/ACT inhaler Inhale into the lungs every 6 (six)  hours as needed for wheezing or shortness of breath.    Historical Provider, MD  ALPRAZolam Prudy Feeler) 0.25 MG tablet Take 0.25-0.5 mg by mouth at bedtime as needed for anxiety.     Historical Provider, MD  butalbital-acetaminophen-caffeine (FIORICET, ESGIC) 50-325-40 MG per tablet Take 1 tablet by mouth every 4 (four) hours as needed for headache.    Historical Provider, MD  ciprofloxacin-hydrocortisone (CIPRO HC) otic suspension Place 3 drops into both ears 2 (two) times daily. 03/14/15   Linna Hoff, MD  diphenhydrAMINE (BENADRYL) 25 MG tablet Take 50 mg by mouth at bedtime as needed for itching.    Historical Provider, MD  doxycycline (ORACEA) 40 MG capsule Take 40 mg by mouth every morning.    Historical Provider, MD  etonogestrel (NEXPLANON) 68 MG IMPL implant Inject 1 each (68 mg total) into the skin once. 10/29/13   Antionette Char, MD  fluconazole (DIFLUCAN) 150 MG tablet Take 1 tablet (150 mg total) by mouth once. Repeat in 1 week. 03/14/15   Linna Hoff, MD  Homeopathic Products [facility]) SUPP Place 6 suppositories vaginally at bedtime as needed (Insert vaginally at bedtime for 6 nights- abstain from intercourse while using). 03/03/15   Brock Bad, MD  hydrOXYzine (ATARAX/VISTARIL) 10 MG tablet Take 10 mg by mouth 3 (three) times daily as needed for itching.     Historical Provider, MD  ibuprofen (ADVIL,MOTRIN) 800 MG tablet Take 800 mg by mouth every 8 (eight) hours as  needed.    Historical Provider, MD  methocarbamol (ROBAXIN) 500 MG tablet Take 2 tablets (1,000 mg total) by mouth 4 (four) times daily as needed (Pain). Patient not taking: Reported on 12/10/2014 08/22/14   Joni Reining Pisciotta, PA-C  norethindrone (AYGESTIN) 5 MG tablet Take 2 tablets (10 mg total) by mouth daily. 12/15/14   Brock Bad, MD  prochlorperazine (COMPAZINE) 5 MG tablet Take 1 tablet (5 mg total) by mouth every 8 (eight) hours as needed for nausea. Patient not taking: Reported on 11/12/2014 09/16/14    Ria Clock, PA  terconazole (TERAZOL 3) 80 MG vaginal suppository Place 1 suppository (80 mg total) vaginally at bedtime. 03/14/15   Linna Hoff, MD  tiZANidine (ZANAFLEX) 4 MG tablet Take 4 mg by mouth every 6 (six) hours as needed for muscle spasms.    Historical Provider, MD  Topiramate ER (TROKENDI XR) 100 MG CP24 Take 100 mg by mouth daily with breakfast.    Historical Provider, MD  triamcinolone (KENALOG) 0.025 % cream Apply 1 application topically 2 (two) times daily as needed (for itching).    Historical Provider, MD  zolpidem (AMBIEN) 10 MG tablet Take 10 mg by mouth at bedtime as needed for sleep.    Historical Provider, MD   BP 128/82 mmHg  Pulse 78  Temp(Src) 98.6 F (37 C) (Oral)  Resp 16  SpO2 99%  LMP  Physical Exam  Constitutional: She is oriented to person, place, and time. She appears well-developed and well-nourished. No distress.  HENT:  Right Ear: There is swelling and tenderness.  Left Ear: There is drainage, swelling and tenderness.  Eyes: Pupils are equal, round, and reactive to light.  Neck: Normal range of motion.  Abdominal: Soft. Bowel sounds are normal.  Neurological: She is alert and oriented to person, place, and time.  Skin: Skin is warm and dry.  Nursing note and vitals reviewed.   ED Course  Procedures (including critical care time) Labs Review Labs Reviewed - No data to display  Imaging Review No results found.   MDM   1. Otitis externa of both ears   2. Candidal vaginitis        Linna Hoff, MD 03/14/15 2020

## 2015-03-15 ENCOUNTER — Telehealth: Payer: Self-pay | Admitting: *Deleted

## 2015-03-15 ENCOUNTER — Telehealth (HOSPITAL_COMMUNITY): Payer: Self-pay | Admitting: Family Medicine

## 2015-03-15 MED ORDER — FLUCONAZOLE 150 MG PO TABS: 150.0000 mg | ORAL_TABLET | Freq: Once | ORAL | Status: AC

## 2015-03-15 NOTE — ED Notes (Signed)
Pt called stating that she is leaving town and would like to be able to pick up the last dose of diflucan but her ins wont let her until 6/22 since it was writtent "may repeat in 1 week".... Dr. Denyse Amass has e-Rx her another Rx but does not guarantee that the ins will pay for this one.... Adv pt and also notified her that it cost $18 if she is willing to pay out of pocket... Dr. Artis Flock is not here to change Rx.... Pt verb understanding.

## 2015-03-15 NOTE — Telephone Encounter (Signed)
Pt placed call to office regarding possible yeast infection and treatment plan. Return call to pt.  Pt states that she has taken Diflucan and Hylafem and still having symptoms of yeast.  Pt states that she was recently seen in Urgent Care and was given Terazol suppository and Diflucan.  Pt was instructed to use Terazol and take 1 Diflucan and repeat Diflucan in 1 week.  Pt would like to know if this is advisable by Dr Clearance Coots.   Reviewed treatment with Dr Clearance Coots, he suggest pt treat as instructed.  If treatment does not work pt will need appt for further examination.   Pt was made aware of Dr Clearance Coots suggestion.  Pt agrees with plan as instructed.  Pt advised to contact office if symptoms do not resolve after treatment. Pt states understanding.

## 2015-03-15 NOTE — ED Notes (Signed)
Patient is about to leave town and was trying to get her repeat dose of fluconazole for yeast infection. Her insurance company will not pay for it. I have re-prescribe this medication in an attempt to help reduced her costs.  Rodolph Bong, MD 03/15/15 [phone]
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
